# Patient Record
Sex: Female | Born: 2000 | Race: White | Hispanic: No | Marital: Single | Smoking: Former smoker
Health system: Southern US, Community
[De-identification: ages and names within clinical notes are randomized; demographics above are authoritative.]

## PROBLEM LIST (undated history)

## (undated) ENCOUNTER — Ambulatory Visit
Discharge status: Absent without leave | Payer: Medicaid Other | Attending: Emergency Medicine | Admitting: Emergency Medicine

## (undated) DIAGNOSIS — R569 Unspecified convulsions: Secondary | ICD-10-CM

## (undated) DIAGNOSIS — F419 Anxiety disorder, unspecified: Secondary | ICD-10-CM

## (undated) HISTORY — PX: INSERTION OF NON VAGINAL CONTRACEPTIVE DEVICE: SHX6253

---
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
## 2021-11-11 ENCOUNTER — Emergency Department (HOSPITAL_COMMUNITY)
Admission: EM | Admit: 2021-11-11 | Discharge: 2021-11-11 | Disposition: A | Discharge status: Home / Self Care | Payer: Medicaid Other | Attending: Emergency Medicine | Admitting: Emergency Medicine

## 2021-11-11 ENCOUNTER — Other Ambulatory Visit: Payer: Self-pay

## 2021-11-11 ENCOUNTER — Encounter (HOSPITAL_COMMUNITY): Payer: Self-pay | Admitting: Emergency Medicine

## 2021-11-11 ENCOUNTER — Emergency Department (HOSPITAL_COMMUNITY): Payer: Medicaid Other

## 2021-11-11 DIAGNOSIS — S0081XA Abrasion of other part of head, initial encounter: Secondary | ICD-10-CM | POA: Insufficient documentation

## 2021-11-11 DIAGNOSIS — R0781 Pleurodynia: Secondary | ICD-10-CM | POA: Insufficient documentation

## 2021-11-11 DIAGNOSIS — M25571 Pain in right ankle and joints of right foot: Secondary | ICD-10-CM | POA: Insufficient documentation

## 2021-11-11 DIAGNOSIS — Y92410 Unspecified street and highway as the place of occurrence of the external cause: Secondary | ICD-10-CM | POA: Insufficient documentation

## 2021-11-11 DIAGNOSIS — Z23 Encounter for immunization: Secondary | ICD-10-CM | POA: Insufficient documentation

## 2021-11-11 DIAGNOSIS — E872 Acidosis, unspecified: Secondary | ICD-10-CM | POA: Insufficient documentation

## 2021-11-11 DIAGNOSIS — R519 Headache, unspecified: Secondary | ICD-10-CM | POA: Diagnosis not present

## 2021-11-11 DIAGNOSIS — M542 Cervicalgia: Secondary | ICD-10-CM | POA: Diagnosis not present

## 2021-11-11 DIAGNOSIS — S0993XA Unspecified injury of face, initial encounter: Secondary | ICD-10-CM | POA: Diagnosis present

## 2021-11-11 LAB — COMPREHENSIVE METABOLIC PANEL
ALT: 12 U/L (ref 0–44)
AST: 19 U/L (ref 15–41)
Albumin: 3.8 g/dL (ref 3.5–5.0)
Alkaline Phosphatase: 56 U/L (ref 38–126)
Anion gap: 7 (ref 5–15)
BUN: 8 mg/dL (ref 6–20)
CO2: 20 mmol/L — ABNORMAL LOW (ref 22–32)
Calcium: 8.7 mg/dL — ABNORMAL LOW (ref 8.9–10.3)
Chloride: 107 mmol/L (ref 98–111)
Creatinine, Ser: 0.73 mg/dL (ref 0.44–1.00)
GFR, Estimated: >60 mL/min (ref >60)
Glucose, Bld: 83 mg/dL (ref 70–99)
Potassium: 3.4 mmol/L — ABNORMAL LOW (ref 3.5–5.1)
Sodium: 134 mmol/L — ABNORMAL LOW (ref 135–145)
Total Bilirubin: 0.6 mg/dL (ref 0.3–1.2)
Total Protein: 6.9 g/dL (ref 6.5–8.1)

## 2021-11-11 LAB — CBC
HCT: 41.6 % (ref 36.0–46.0)
Hemoglobin: 13.9 g/dL (ref 12.0–15.0)
MCH: 30 pg (ref 26.0–34.0)
MCHC: 33.4 g/dL (ref 30.0–36.0)
MCV: 89.7 fL (ref 80.0–100.0)
Platelets: 352 10*3/uL (ref 150–400)
RBC: 4.64 MIL/uL (ref 3.87–5.11)
RDW: 12.9 % (ref 11.5–15.5)
WBC: 8.1 10*3/uL (ref 4.0–10.5)
nRBC: 0 % (ref 0.0–0.2)

## 2021-11-11 LAB — LACTIC ACID, PLASMA: Lactic Acid, Venous: 2.2 mmol/L (ref 0.5–1.9)

## 2021-11-11 LAB — I-STAT CHEM 8, ED
BUN: 8 mg/dL (ref 6–20)
Calcium, Ion: 1.11 mmol/L — ABNORMAL LOW (ref 1.15–1.40)
Chloride: 107 mmol/L (ref 98–111)
Creatinine, Ser: 0.7 mg/dL (ref 0.44–1.00)
Glucose, Bld: 79 mg/dL (ref 70–99)
HCT: 42 % (ref 36.0–46.0)
Hemoglobin: 14.3 g/dL (ref 12.0–15.0)
Potassium: 3.6 mmol/L (ref 3.5–5.1)
Sodium: 141 mmol/L (ref 135–145)
TCO2: 20 mmol/L — ABNORMAL LOW (ref 22–32)

## 2021-11-11 LAB — PROTIME-INR
INR: 1 (ref 0.8–1.2)
Prothrombin Time: 13.1 seconds (ref 11.4–15.2)

## 2021-11-11 LAB — ETHANOL: Alcohol, Ethyl (B): 15 mg/dL — ABNORMAL HIGH (ref <10)

## 2021-11-11 LAB — SAMPLE TO BLOOD BANK

## 2021-11-11 LAB — I-STAT BETA HCG BLOOD, ED (MC, WL, AP ONLY): I-stat hCG, quantitative: <5 m[IU]/mL (ref <5)

## 2021-11-11 MED ORDER — LACTATED RINGERS IV BOLUS
1000.0000 mL | Freq: Once | INTRAVENOUS | Status: AC
  Administered 2021-11-11: 1000 mL via INTRAVENOUS

## 2021-11-11 MED ORDER — METHOCARBAMOL 500 MG PO TABS: 500.0000 mg | ORAL_TABLET | Freq: Two times a day (BID) | ORAL | 0 refills | Status: DC

## 2021-11-11 MED ORDER — OXYCODONE-ACETAMINOPHEN 5-325 MG PO TABS
1.0000 | ORAL_TABLET | Freq: Once | ORAL | Status: AC
  Administered 2021-11-11: 1 via ORAL
  Filled 2021-11-11: qty 1

## 2021-11-11 MED ORDER — TETANUS-DIPHTH-ACELL PERTUSSIS 5-2.5-18.5 LF-MCG/0.5 IM SUSY
0.5000 mL | PREFILLED_SYRINGE | Freq: Once | INTRAMUSCULAR | Status: AC
  Administered 2021-11-11: 0.5 mL via INTRAMUSCULAR
  Filled 2021-11-11: qty 0.5

## 2021-11-11 MED ORDER — FENTANYL CITRATE PF 50 MCG/ML IJ SOSY
50.0000 ug | PREFILLED_SYRINGE | Freq: Once | INTRAMUSCULAR | Status: AC
  Administered 2021-11-11: 50 ug via INTRAVENOUS
  Filled 2021-11-11: qty 1

## 2021-11-11 MED ORDER — ACETAMINOPHEN 325 MG PO TABS
325.0000 mg | ORAL_TABLET | Freq: Once | ORAL | Status: AC
  Administered 2021-11-11: 325 mg via ORAL
  Filled 2021-11-11: qty 1

## 2021-11-11 MED ORDER — POTASSIUM CHLORIDE CRYS ER 20 MEQ PO TBCR: 40.0000 meq | EXTENDED_RELEASE_TABLET | Freq: Once | ORAL | Status: DC

## 2021-11-11 MED ORDER — OXYCODONE-ACETAMINOPHEN 5-325 MG PO TABS: 1.0000 | ORAL_TABLET | ORAL | 0 refills | Status: DC | PRN
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
## 2021-11-11 NOTE — ED Notes (Signed)
Attempted to ambulate pt, pt complained of weakness and headache and was not able to stand. Pt sat up for a few moments, but had to lay back down to catch her breath
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
## 2021-11-11 NOTE — Progress Notes (Deleted)
      11/11/21 0400  Clinical Encounter Type  Visited With Patient;Patient and family together
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
## 2021-11-14 DIAGNOSIS — R55 Syncope and collapse: Secondary | ICD-10-CM | POA: Diagnosis not present

## 2023-01-14 ENCOUNTER — Encounter (HOSPITAL_COMMUNITY): Payer: Self-pay

## 2023-01-14 ENCOUNTER — Emergency Department (HOSPITAL_COMMUNITY)
Admission: EM | Admit: 2023-01-14 | Discharge: 2023-01-15 | Disposition: A | Discharge status: Home / Self Care | Payer: MEDICAID | Attending: Emergency Medicine | Admitting: Emergency Medicine

## 2023-01-14 ENCOUNTER — Other Ambulatory Visit: Payer: Self-pay

## 2023-01-14 DIAGNOSIS — J029 Acute pharyngitis, unspecified: Secondary | ICD-10-CM | POA: Diagnosis not present

## 2023-01-14 DIAGNOSIS — R109 Unspecified abdominal pain: Secondary | ICD-10-CM | POA: Diagnosis present

## 2023-01-14 DIAGNOSIS — D72829 Elevated white blood cell count, unspecified: Secondary | ICD-10-CM | POA: Insufficient documentation

## 2023-01-14 DIAGNOSIS — R1084 Generalized abdominal pain: Secondary | ICD-10-CM | POA: Diagnosis not present

## 2023-01-14 DIAGNOSIS — Z20822 Contact with and (suspected) exposure to covid-19: Secondary | ICD-10-CM | POA: Insufficient documentation

## 2023-01-14 DIAGNOSIS — B349 Viral infection, unspecified: Secondary | ICD-10-CM | POA: Insufficient documentation

## 2023-01-14 DIAGNOSIS — Z720 Tobacco use: Secondary | ICD-10-CM | POA: Insufficient documentation

## 2023-01-14 LAB — URINALYSIS, ROUTINE W REFLEX MICROSCOPIC
Bilirubin Urine: NEGATIVE
Glucose, UA: NEGATIVE mg/dL
Hgb urine dipstick: NEGATIVE
Ketones, ur: NEGATIVE mg/dL
Leukocytes,Ua: NEGATIVE
Nitrite: NEGATIVE
Protein, ur: NEGATIVE mg/dL
Specific Gravity, Urine: 1.017 (ref 1.005–1.030)
pH: 6 (ref 5.0–8.0)

## 2023-01-14 LAB — COMPREHENSIVE METABOLIC PANEL
ALT: 15 U/L (ref 0–44)
AST: 19 U/L (ref 15–41)
Albumin: 4 g/dL (ref 3.5–5.0)
Alkaline Phosphatase: 71 U/L (ref 38–126)
Anion gap: 9 (ref 5–15)
BUN: 6 mg/dL (ref 6–20)
CO2: 24 mmol/L (ref 22–32)
Calcium: 9 mg/dL (ref 8.9–10.3)
Chloride: 103 mmol/L (ref 98–111)
Creatinine, Ser: 0.75 mg/dL (ref 0.44–1.00)
GFR, Estimated: >60 mL/min (ref >60)
Glucose, Bld: 92 mg/dL (ref 70–99)
Potassium: 3.5 mmol/L (ref 3.5–5.1)
Sodium: 136 mmol/L (ref 135–145)
Total Bilirubin: 0.6 mg/dL (ref 0.3–1.2)
Total Protein: 7.7 g/dL (ref 6.5–8.1)

## 2023-01-14 LAB — CBC
HCT: 44.1 % (ref 36.0–46.0)
Hemoglobin: 14.6 g/dL (ref 12.0–15.0)
MCH: 31 pg (ref 26.0–34.0)
MCHC: 33.1 g/dL (ref 30.0–36.0)
MCV: 93.6 fL (ref 80.0–100.0)
Platelets: 354 10*3/uL (ref 150–400)
RBC: 4.71 MIL/uL (ref 3.87–5.11)
RDW: 12.9 % (ref 11.5–15.5)
WBC: 11.1 10*3/uL — ABNORMAL HIGH (ref 4.0–10.5)
nRBC: 0 % (ref 0.0–0.2)

## 2023-01-14 LAB — HCG, QUANTITATIVE, PREGNANCY: hCG, Beta Chain, Quant, S: <1 m[IU]/mL (ref <5)

## 2023-01-14 LAB — LIPASE, BLOOD: Lipase: 29 U/L (ref 11–51)

## 2023-01-14 NOTE — ED Provider Triage Note (Signed)
Emergency Medicine Provider Triage Evaluation Note  Adrienne Ruiz , a 22 y.o. female  was evaluated in triage.  Pt complains of abdominal pain, body aches. 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 Mom with hx of thyroid problems and questions if she has this bc she wakes up feeling like she can't breath, can't find inhaler. Symptoms started 1 week ago.  Negative for COVID and flu.  Review of Systems  Positive: Vomiting, diarrhea Negative: Fever,   Physical Exam  BP 133/75   Pulse 86   Temp 98.7 F (37.1 C) (Oral)   Resp 19   Ht 5\' 5"  (1.651 m)   Wt 73.2 kg   SpO2 97%   BMI 26.84 kg/m  Gen:   Awake, no distress   Resp:  Normal effort  MSK:   Moves extremities without difficulty  Other:    Medical Decision Making  Medically screening exam initiated at 5:41 PM.  Appropriate orders placed.  Adrienne Ruiz was informed that the remainder of the evaluation will be completed by another provider, this initial triage assessment does not replace that evaluation, and the importance of remaining in the ED until their evaluation is complete.     Adrienne Fend, PA-C 01/14/23 1742

## 2023-01-14 NOTE — ED Triage Notes (Signed)
C/o generalized abdominal pain, sore throat, generalized body aches. Covid and flu swab negative Saturday. Pt states when she sleeps, she is woken up and unable to breath. Respirations equal and unlabored in triage. No swelling noted in throat.

## 2023-01-15 ENCOUNTER — Emergency Department (HOSPITAL_COMMUNITY): Payer: MEDICAID

## 2023-01-15 LAB — RESP PANEL BY RT-PCR (RSV, FLU A&B, COVID)  RVPGX2
Influenza A by PCR: NEGATIVE
Influenza B by PCR: NEGATIVE
Resp Syncytial Virus by PCR: NEGATIVE
SARS Coronavirus 2 by RT PCR: NEGATIVE

## 2023-01-15 LAB — GROUP A STREP BY PCR: Group A Strep by PCR: NOT DETECTED

## 2023-01-15 LAB — MONONUCLEOSIS SCREEN: Mono Screen: NEGATIVE

## 2023-01-15 LAB — CK: Total CK: 125 U/L (ref 38–234)

## 2023-01-15 LAB — TROPONIN I (HIGH SENSITIVITY): Troponin I (High Sensitivity): <2 ng/L (ref <18)

## 2023-01-15 LAB — MAGNESIUM: Magnesium: 2.1 mg/dL (ref 1.7–2.4)

## 2023-01-15 LAB — TSH: TSH: 1.033 u[IU]/mL (ref 0.350–4.500)

## 2023-01-15 MED ORDER — METHYLPREDNISOLONE 4 MG PO TBPK: ORAL_TABLET | Freq: Every day | ORAL | 0 refills | Status: AC

## 2023-01-15 MED ORDER — ACETAMINOPHEN 500 MG PO TABS: 1000.0000 mg | ORAL_TABLET | Freq: Once | ORAL | Status: DC

## 2023-01-15 MED ORDER — SODIUM CHLORIDE (PF) 0.9 % IJ SOLN
INTRAMUSCULAR | Status: AC
  Filled 2023-01-15: qty 50

## 2023-01-15 MED ORDER — LACTATED RINGERS IV BOLUS
1000.0000 mL | Freq: Once | INTRAVENOUS | Status: AC
  Administered 2023-01-15: 1000 mL via INTRAVENOUS

## 2023-01-15 MED ORDER — DEXAMETHASONE SODIUM PHOSPHATE 10 MG/ML IJ SOLN
10.0000 mg | Freq: Once | INTRAMUSCULAR | Status: AC
  Administered 2023-01-15: 10 mg via INTRAVENOUS
  Filled 2023-01-15: qty 1

## 2023-01-15 MED ORDER — IPRATROPIUM-ALBUTEROL 0.5-2.5 (3) MG/3ML IN SOLN
3.0000 mL | Freq: Once | RESPIRATORY_TRACT | Status: AC
  Administered 2023-01-15: 3 mL via RESPIRATORY_TRACT
  Filled 2023-01-15: qty 3

## 2023-01-15 MED ORDER — OXYCODONE-ACETAMINOPHEN 5-325 MG PO TABS
1.0000 | ORAL_TABLET | Freq: Once | ORAL | Status: AC
  Administered 2023-01-15: 1 via ORAL
  Filled 2023-01-15: qty 1

## 2023-01-15 MED ORDER — LIDOCAINE VISCOUS HCL 2 % MT SOLN
15.0000 mL | Freq: Once | OROMUCOSAL | Status: AC
  Administered 2023-01-15: 15 mL via OROMUCOSAL
  Filled 2023-01-15: qty 15

## 2023-01-15 MED ORDER — ACETAMINOPHEN 325 MG PO TABS
650.0000 mg | ORAL_TABLET | Freq: Once | ORAL | Status: DC
  Filled 2023-01-15: qty 2

## 2023-01-15 MED ORDER — LIDOCAINE VISCOUS HCL 2 % MT SOLN: 15.0000 mL | OROMUCOSAL | 0 refills | Status: DC | PRN

## 2023-01-15 MED ORDER — ONDANSETRON HCL 4 MG/2ML IJ SOLN: 4.0000 mg | Freq: Once | INTRAMUSCULAR | Status: DC

## 2023-01-15 MED ORDER — IOHEXOL 300 MG/ML  SOLN
100.0000 mL | Freq: Once | INTRAMUSCULAR | Status: AC | PRN
  Administered 2023-01-15: 100 mL via INTRAVENOUS

## 2023-01-15 MED ORDER — PROCHLORPERAZINE EDISYLATE 10 MG/2ML IJ SOLN
10.0000 mg | Freq: Once | INTRAMUSCULAR | Status: AC
  Administered 2023-01-15: 10 mg via INTRAVENOUS
  Filled 2023-01-15: qty 2

## 2023-01-15 MED ORDER — DIPHENHYDRAMINE HCL 50 MG/ML IJ SOLN
25.0000 mg | Freq: Once | INTRAMUSCULAR | Status: AC
  Administered 2023-01-15: 25 mg via INTRAVENOUS
  Filled 2023-01-15: qty 1

## 2023-01-15 NOTE — ED Provider Notes (Signed)
[place] EMERGENCY DEPARTMENT AT [facility] Provider Note   CSN: [id number] Arrival date & time: 01/14/23  1718     History {Add pertinent medical, surgical, social history, OB history to HPI:1} Chief Complaint  Patient presents with   Abdominal Pain   Sore Throat    Adrienne Ruiz is a 22 y.o. female.   Abdominal Pain Sore Throat Associated symptoms include abdominal pain.       Home Medications Prior to Admission medications   Medication Sig Start Date End Date Taking? Authorizing Provider  acetaminophen (TYLENOL) 325 MG tablet Take 650 mg by mouth every 6 (six) hours as needed for headache.    [provider]  ARIPiprazole (ABILIFY) 5 MG tablet Take 1 tablet (5 mg total) by mouth daily. 05/08/17   Denzil Magnuson, NP  busPIRone (BUSPAR) 5 MG tablet Take 1 tablet (5 mg total) by mouth 2 (two) times daily. 05/07/17   Denzil Magnuson, NP  cyclobenzaprine (FLEXERIL) 5 MG tablet Take 1 tablet (5 mg total) by mouth 3 (three) times daily as needed. 06/06/18   Menshew, Charlesetta Ivory, PA-C  etonogestrel (NEXPLANON) 68 MG IMPL implant 1 each by Subdermal route once.    [provider]  meloxicam (MOBIC) 7.5 MG tablet Take 1 tablet (7.5 mg total) by mouth daily. 06/06/18   Domenick Gong, MD  methocarbamol (ROBAXIN) 500 MG tablet Take 1 tablet (500 mg total) by mouth 2 (two) times daily. 11/11/21   Mesner, Barbara Cower, MD  oxyCODONE-acetaminophen (PERCOCET) 5-325 MG tablet Take 1-2 tablets by mouth every 4 (four) hours as needed. 11/11/21   Mesner, Barbara Cower, MD      Allergies    Ibuprofen and Naproxen    Review of Systems   Review of Systems  Gastrointestinal:  Positive for abdominal pain.    Physical Exam Updated Vital Signs BP 112/70   Pulse 76   Temp 97.9 F (36.6 C) (Oral)   Resp 14   Ht 5\' 5"  (1.651 m)   Wt 73.2 kg   LMP 01/06/2023   SpO2 100%   BMI 26.84 kg/m  Physical Exam  ED Results / Procedures / Treatments   Labs (all labs  ordered are listed, but only abnormal results are displayed) Labs Reviewed  CBC - Abnormal; Notable for the following components:      Result Value   WBC 11.1 (*)    All other components within normal limits  RESP PANEL BY RT-PCR (RSV, FLU A&B, COVID)  [id number]  GROUP A STREP BY PCR  LIPASE, BLOOD  COMPREHENSIVE METABOLIC PANEL  URINALYSIS, ROUTINE W REFLEX MICROSCOPIC  HCG, QUANTITATIVE, PREGNANCY  MAGNESIUM  CK  MONONUCLEOSIS SCREEN  TSH  TROPONIN I (HIGH SENSITIVITY)    EKG EKG Interpretation Date/Time:  Tuesday January 15 2023 02:59:13 EDT Ventricular Rate:  84 PR Interval:  191 QRS Duration:  91 QT Interval:  367 QTC Calculation: 434 R Axis:   80  Text Interpretation: Sinus rhythm Normal ECG When compared with ECG of 01/17/2018, No significant change was found Confirmed by Dione Booze ([id number]) on 01/15/2023 5:38:01 AM  Radiology CT ABDOMEN PELVIS W CONTRAST  Result Date: 01/15/2023 CLINICAL DATA:  Acute, nonlocalized abdominal pain EXAM: CT ABDOMEN AND PELVIS WITH CONTRAST TECHNIQUE: Multidetector CT imaging of the abdomen and pelvis was performed using the standard protocol following bolus administration of intravenous contrast. RADIATION DOSE REDUCTION: This exam was performed according to the departmental dose-optimization program which includes automated exposure control, adjustment of the mA and/or kV  according to patient size and/or use of iterative reconstruction technique. CONTRAST:  OMNIPAQUE IOHEXOL 300 MG/ML  SOLN COMPARISON:  10/22/2022 abdominal CT report FINDINGS: Lower chest:  No contributory findings. Hepatobiliary: No focal liver abnormality.No evidence of biliary obstruction or stone. Pancreas: Unremarkable. Spleen: Unremarkable. Adrenals/Urinary Tract: Negative adrenals. No hydronephrosis or stone. Unremarkable bladder. Stomach/Bowel:  No obstruction. No appendicitis. Vascular/Lymphatic: No acute vascular abnormality. No mass or adenopathy.  Reproductive:No pathologic findings. Other: Trace peritoneal fluid which could be physiologic. Musculoskeletal: No acute abnormalities. IMPRESSION: No acute finding or explanation for symptoms. Electronically Signed   By: Tiburcio Pea M.D.   On: 01/15/2023 05:32   DG Chest 2 View  Result Date: 01/15/2023 CLINICAL DATA:  Shortness of breath, body aches. EXAM: CHEST - 2 VIEW COMPARISON:  11/11/2021. FINDINGS: The heart size and mediastinal contours are within normal limits. No consolidation, effusion, or pneumothorax. No acute osseous abnormality. IMPRESSION: No active cardiopulmonary disease. Electronically Signed   By: Thornell Sartorius M.D.   On: 01/15/2023 04:41    Procedures Procedures  {Document cardiac monitor, telemetry assessment procedure when appropriate:1}  Medications Ordered in ED Medications  acetaminophen (TYLENOL) tablet 650 mg (650 mg Oral Patient Refused/Not Given 01/15/23 0429)  lactated ringers bolus 1,000 mL (1,000 mLs Intravenous New Bag/Given 01/15/23 0326)  oxyCODONE-acetaminophen (PERCOCET/ROXICET) 5-325 MG per tablet 1 tablet (1 tablet Oral Given 01/15/23 0326)  prochlorperazine (COMPAZINE) injection 10 mg (10 mg Intravenous Given 01/15/23 0323)  diphenhydrAMINE (BENADRYL) injection 25 mg (25 mg Intravenous Given 01/15/23 0324)  dexamethasone (DECADRON) injection 10 mg (10 mg Intravenous Given 01/15/23 0317)  lidocaine (XYLOCAINE) 2 % viscous mouth solution 15 mL (15 mLs Mouth/Throat Given 01/15/23 0307)  iohexol (OMNIPAQUE) 300 MG/ML solution 100 mL (100 mLs Intravenous Contrast Given 01/15/23 0357)  ipratropium-albuterol (DUONEB) 0.5-2.5 (3) MG/3ML nebulizer solution 3 mL (3 mLs Nebulization Given 01/15/23 0543)    ED Course/ Medical Decision Making/ A&P   {   Click here for ABCD2, HEART and other calculatorsREFRESH Note before signing :1}                              Medical Decision Making Amount and/or Complexity of Data Reviewed Labs: ordered. Radiology:  ordered.  Risk OTC drugs. Prescription drug management.   ***  {Document critical care time when appropriate:1} {Document review of labs and clinical decision tools ie heart score, Chads2Vasc2 etc:1}  {Document your independent review of radiology images, and any outside records:1} {Document your discussion with family members, caretakers, and with consultants:1} {Document social determinants of health affecting pt's care:1} {Document your decision making why or why not admission, treatments were needed:1} Final Clinical Impression(s) / ED Diagnoses Final diagnoses:  None    Rx / DC Orders ED Discharge Orders     None

## 2023-01-15 NOTE — Discharge Instructions (Addendum)
You were seen in the ER today for evaluation of your cough and cold symptoms. Your lab work was overall unremarkable. Your CT scan and CXR were unremarkable. This is likely a viral illness. I am glad that you are feeling better. Please make sure you follow up with your PCP in the next few days for re-evaluation. You can try OTC cough and cold medication for your symptoms. Additionally, I am sending you home on two medications for you to take as prescribed. If you have any concerns, new or worsening symptoms, please return to the ER for re-evaluation.   Contact a health care provider if: You have symptoms of a viral illness that do not go away. Your symptoms come back after going away. Your symptoms get worse. Get help right away if: You have trouble breathing. You have a severe headache or a stiff neck. You have severe vomiting or pain in your abdomen. These symptoms may be an emergency. Get help right away. Call 911. Do not wait to see if the symptoms will go away. Do not drive yourself to the hospital.

## 2023-08-29 ENCOUNTER — Ambulatory Visit: Payer: MEDICAID | Admitting: Speech Pathology

## 2023-08-30 ENCOUNTER — Ambulatory Visit: Payer: MEDICAID | Attending: Speech Pathology | Admitting: Speech Pathology

## 2023-09-19 ENCOUNTER — Other Ambulatory Visit: Payer: Self-pay

## 2023-09-19 ENCOUNTER — Encounter (HOSPITAL_COMMUNITY): Payer: Self-pay | Admitting: *Deleted

## 2023-09-19 ENCOUNTER — Emergency Department (HOSPITAL_COMMUNITY)
Admission: EM | Admit: 2023-09-19 | Discharge: 2023-09-20 | Disposition: A | Discharge status: Home / Self Care | Payer: MEDICAID | Attending: Emergency Medicine | Admitting: Emergency Medicine

## 2023-09-19 DIAGNOSIS — R079 Chest pain, unspecified: Secondary | ICD-10-CM | POA: Insufficient documentation

## 2023-09-19 DIAGNOSIS — R519 Headache, unspecified: Secondary | ICD-10-CM | POA: Insufficient documentation

## 2023-09-19 DIAGNOSIS — T754XXA Electrocution, initial encounter: Secondary | ICD-10-CM | POA: Diagnosis present

## 2023-09-19 LAB — CBC
HCT: 42.7 % (ref 36.0–46.0)
Hemoglobin: 14.1 g/dL (ref 12.0–15.0)
MCH: 30.7 pg (ref 26.0–34.0)
MCHC: 33 g/dL (ref 30.0–36.0)
MCV: 93 fL (ref 80.0–100.0)
Platelets: 371 10*3/uL (ref 150–400)
RBC: 4.59 MIL/uL (ref 3.87–5.11)
RDW: 12.7 % (ref 11.5–15.5)
WBC: 10.2 10*3/uL (ref 4.0–10.5)
nRBC: 0 % (ref 0.0–0.2)

## 2023-09-19 LAB — BASIC METABOLIC PANEL WITH GFR
Anion gap: 14 (ref 5–15)
BUN: 7 mg/dL (ref 6–20)
CO2: 19 mmol/L — ABNORMAL LOW (ref 22–32)
Calcium: 9.2 mg/dL (ref 8.9–10.3)
Chloride: 103 mmol/L (ref 98–111)
Creatinine, Ser: 0.71 mg/dL (ref 0.44–1.00)
GFR, Estimated: >60 mL/min (ref >60)
Glucose, Bld: 89 mg/dL (ref 70–99)
Potassium: 3.5 mmol/L (ref 3.5–5.1)
Sodium: 136 mmol/L (ref 135–145)

## 2023-09-19 LAB — HCG, SERUM, QUALITATIVE: Preg, Serum: NEGATIVE

## 2023-09-19 LAB — TROPONIN I (HIGH SENSITIVITY): Troponin I (High Sensitivity): 3 ng/L (ref <18)

## 2023-09-19 MED ORDER — FENTANYL CITRATE PF 50 MCG/ML IJ SOSY
100.0000 ug | PREFILLED_SYRINGE | Freq: Once | INTRAMUSCULAR | Status: DC
  Administered 2023-09-19: 100 ug via INTRAVENOUS
  Filled 2023-09-19: qty 2

## 2023-09-19 MED ORDER — SODIUM CHLORIDE 0.9 % IV BOLUS (SEPSIS)
1000.0000 mL | Freq: Once | INTRAVENOUS | Status: AC
  Administered 2023-09-19: 1000 mL via INTRAVENOUS
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
## 2023-09-20 ENCOUNTER — Emergency Department (HOSPITAL_COMMUNITY): Payer: MEDICAID

## 2023-09-20 LAB — CK: Total CK: 226 U/L (ref 38–234)

## 2023-09-20 MED ORDER — KETOROLAC TROMETHAMINE 15 MG/ML IJ SOLN
15.0000 mg | Freq: Once | INTRAMUSCULAR | Status: AC
  Administered 2023-09-20: 15 mg via INTRAVENOUS
  Filled 2023-09-20: qty 1

## 2023-09-20 MED ORDER — ONDANSETRON HCL 4 MG/2ML IJ SOLN
4.0000 mg | Freq: Once | INTRAMUSCULAR | Status: AC
  Administered 2023-09-20: 4 mg via INTRAVENOUS
  Filled 2023-09-20: qty 2

## 2023-11-07 ENCOUNTER — Ambulatory Visit: Payer: MEDICAID | Attending: Speech Pathology | Admitting: Speech Pathology
